# Patient Record
Sex: Male | Born: 1995 | Race: White | Hispanic: No | Marital: Single | Smoking: Never smoker
Health system: Southern US, Community
[De-identification: ages and names within clinical notes are randomized; demographics above are authoritative.]

## PROBLEM LIST (undated history)

## (undated) DIAGNOSIS — R55 Syncope and collapse: Secondary | ICD-10-CM

## (undated) HISTORY — DX: Syncope and collapse: R55

---
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
## 2021-11-30 ENCOUNTER — Emergency Department (HOSPITAL_BASED_OUTPATIENT_CLINIC_OR_DEPARTMENT_OTHER)
Admission: EM | Admit: 2021-11-30 | Discharge: 2021-11-30 | Disposition: A | Discharge status: Home / Self Care | Payer: BC Managed Care – PPO | Attending: Emergency Medicine | Admitting: Emergency Medicine

## 2021-11-30 ENCOUNTER — Encounter (HOSPITAL_BASED_OUTPATIENT_CLINIC_OR_DEPARTMENT_OTHER): Payer: Self-pay | Admitting: Emergency Medicine

## 2021-11-30 ENCOUNTER — Other Ambulatory Visit: Payer: Self-pay

## 2021-11-30 ENCOUNTER — Emergency Department (HOSPITAL_BASED_OUTPATIENT_CLINIC_OR_DEPARTMENT_OTHER): Payer: BC Managed Care – PPO

## 2021-11-30 DIAGNOSIS — R55 Syncope and collapse: Secondary | ICD-10-CM | POA: Diagnosis not present

## 2021-11-30 LAB — URINALYSIS, MICROSCOPIC (REFLEX)

## 2021-11-30 LAB — URINALYSIS, ROUTINE W REFLEX MICROSCOPIC
Bilirubin Urine: NEGATIVE
Glucose, UA: NEGATIVE mg/dL
Hgb urine dipstick: NEGATIVE
Ketones, ur: NEGATIVE mg/dL
Leukocytes,Ua: NEGATIVE
Nitrite: NEGATIVE
Protein, ur: 30 mg/dL — AB
Specific Gravity, Urine: 1.025 (ref 1.005–1.030)
pH: 6 (ref 5.0–8.0)

## 2021-11-30 LAB — COMPREHENSIVE METABOLIC PANEL
ALT: 17 U/L (ref 0–44)
AST: 21 U/L (ref 15–41)
Albumin: 4.7 g/dL (ref 3.5–5.0)
Alkaline Phosphatase: 59 U/L (ref 38–126)
Anion gap: 7 (ref 5–15)
BUN: 13 mg/dL (ref 6–20)
CO2: 26 mmol/L (ref 22–32)
Calcium: 9.8 mg/dL (ref 8.9–10.3)
Chloride: 106 mmol/L (ref 98–111)
Creatinine, Ser: 0.96 mg/dL (ref 0.61–1.24)
GFR, Estimated: >60 mL/min (ref >60)
Glucose, Bld: 106 mg/dL — ABNORMAL HIGH (ref 70–99)
Potassium: 4.1 mmol/L (ref 3.5–5.1)
Sodium: 139 mmol/L (ref 135–145)
Total Bilirubin: 0.6 mg/dL (ref 0.3–1.2)
Total Protein: 7.5 g/dL (ref 6.5–8.1)

## 2021-11-30 LAB — CBC WITH DIFFERENTIAL/PLATELET
Abs Immature Granulocytes: 0.03 10*3/uL (ref 0.00–0.07)
Basophils Absolute: 0 10*3/uL (ref 0.0–0.1)
Basophils Relative: 1 %
Eosinophils Absolute: 0 10*3/uL (ref 0.0–0.5)
Eosinophils Relative: 0 %
HCT: 44.8 % (ref 39.0–52.0)
Hemoglobin: 15.7 g/dL (ref 13.0–17.0)
Immature Granulocytes: 0 %
Lymphocytes Relative: 12 %
Lymphs Abs: 0.9 10*3/uL (ref 0.7–4.0)
MCH: 31 pg (ref 26.0–34.0)
MCHC: 35 g/dL (ref 30.0–36.0)
MCV: 88.4 fL (ref 80.0–100.0)
Monocytes Absolute: 0.5 10*3/uL (ref 0.1–1.0)
Monocytes Relative: 7 %
Neutro Abs: 6.2 10*3/uL (ref 1.7–7.7)
Neutrophils Relative %: 80 %
Platelets: 197 10*3/uL (ref 150–400)
RBC: 5.07 MIL/uL (ref 4.22–5.81)
RDW: 12 % (ref 11.5–15.5)
WBC: 7.7 10*3/uL (ref 4.0–10.5)
nRBC: 0 % (ref 0.0–0.2)

## 2021-11-30 LAB — RAPID URINE DRUG SCREEN, HOSP PERFORMED
Amphetamines: NOT DETECTED
Barbiturates: NOT DETECTED
Benzodiazepines: NOT DETECTED
Cocaine: NOT DETECTED
Opiates: NOT DETECTED
Tetrahydrocannabinol: NOT DETECTED

## 2021-11-30 MED ORDER — SODIUM CHLORIDE 0.9 % IV BOLUS
1000.0000 mL | Freq: Once | INTRAVENOUS | Status: AC
  Administered 2021-11-30: 1000 mL via INTRAVENOUS

## 2021-11-30 NOTE — Discharge Instructions (Addendum)
Follow-up with neurology for further seizure work-up.  Do not do any dangerous activities including driving as discussed.  Follow-up with cardiology as well to discuss wearing a cardiac monitor/cardiac ultrasound.

## 2021-11-30 NOTE — ED Notes (Signed)
Patient transported to CT and xray 
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
## 2021-12-03 ENCOUNTER — Ambulatory Visit: Payer: BC Managed Care – PPO | Admitting: Psychiatry

## 2021-12-03 ENCOUNTER — Other Ambulatory Visit: Payer: Self-pay

## 2021-12-03 ENCOUNTER — Encounter: Payer: Self-pay | Admitting: Psychiatry

## 2021-12-03 VITALS — BP 118/76 | HR 82 | Ht 73.0 in | Wt 144.0 lb

## 2021-12-03 DIAGNOSIS — R55 Syncope and collapse: Secondary | ICD-10-CM
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
## 2021-12-05 ENCOUNTER — Other Ambulatory Visit: Payer: Self-pay

## 2021-12-05 ENCOUNTER — Encounter: Payer: Self-pay | Admitting: Cardiology

## 2021-12-05 ENCOUNTER — Ambulatory Visit (INDEPENDENT_AMBULATORY_CARE_PROVIDER_SITE_OTHER): Payer: BC Managed Care – PPO

## 2021-12-05 ENCOUNTER — Ambulatory Visit: Payer: BC Managed Care – PPO | Admitting: Cardiology

## 2021-12-05 VITALS — BP 114/62 | HR 66 | Ht 73.0 in | Wt 143.0 lb

## 2021-12-05 DIAGNOSIS — R55 Syncope and collapse: Secondary | ICD-10-CM
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
## 2021-12-05 NOTE — Progress Notes (Unsigned)
Enrolled for [employer] to mail a ZIO XT long term holter monitor to the patients address on file.  

## 2021-12-05 NOTE — Patient Instructions (Signed)
Medication Instructions:  °Your physician recommends that you continue on your current medications as directed. Please refer to the Current Medication list given to you today.  °*If you need a refill on your cardiac medications before your next appointment, please call your pharmacy* ° ° °Lab Work: °None °If you have labs (blood work) drawn today and your tests are completely normal, you will receive your results only by: °MyChart Message (if you have MyChart) OR °A paper copy in the mail °If you have any lab test that is abnormal or we need to change your treatment, we will call you to review the results. ° ° °Testing/Procedures: °Your physician has requested that you have an echocardiogram. Echocardiography is a painless test that uses sound waves to create images of your heart. It provides your doctor with information about the size and shape of your heart and how well your heart’s chambers and valves are working. This procedure takes approximately one hour. There are no restrictions for this procedure. ° °ZIO XT- Long Term Monitor Instructions ° °Your physician has requested you wear a ZIO patch monitor for 14 days.  °This is a single patch monitor. [employer] supplies one patch monitor per enrollment. Additional °stickers are not available. Please do not apply patch if you will be having a Nuclear Stress Test,  °Echocardiogram, Cardiac CT, MRI, or Chest Xray during the period you would be wearing the  °monitor. The patch cannot be worn during these tests. You cannot remove and re-apply the  °ZIO XT patch monitor.  °Your ZIO patch monitor will be mailed 3 day USPS to your address on file. It may take 3-5 days  °to receive your monitor after you have been enrolled.  °Once you have received your monitor, please review the enclosed instructions. Your monitor  °has already been registered assigning a specific monitor serial # to you. ° °Billing and Patient Assistance Program Information ° °We have supplied [employer] with  any of your insurance information on file for billing purposes. °[employer] offers a sliding scale Patient Assistance Program for patients that do not have  °insurance, or whose insurance does not completely cover the cost of the ZIO monitor.  °You must apply for the Patient Assistance Program to qualify for this discounted rate.  °To apply, please call [employer] at [phone], select option 4, select option 2, ask to apply for  °Patient Assistance Program. [employer] will ask your household income, and how many people  °are in your household. They will quote your out-of-pocket cost based on that information.  °[employer] will also be able to set up a 12-month, interest-free payment plan if needed. ° °Applying the monitor °  °Shave hair from upper left chest.  °Hold abrader disc by orange tab. Rub abrader in 40 strokes over the upper left chest as  °indicated in your monitor instructions.  °Clean area with 4 enclosed alcohol pads. Let dry.  °Apply patch as indicated in monitor instructions. Patch will be placed under collarbone on left  °side of chest with arrow pointing upward.  °Rub patch adhesive wings for 2 minutes. Remove white label marked "1". Remove the white  °label marked "2". Rub patch adhesive wings for 2 additional minutes.  °While looking in a mirror, press and release button in center of patch. A small green light will  °flash 3-4 times. This will be your only indicator that the monitor has been turned on.  °Do not shower for the first 24 hours. You may shower after the first   24 hours.  °Press the button if you feel a symptom. You will hear a small click. Record Date, Time and  °Symptom in the Patient Logbook.  °When you are ready to remove the patch, follow instructions on the last 2 pages of Patient  °Logbook. Stick patch monitor onto the last page of Patient Logbook.  °Place Patient Logbook in the blue and white box. Use locking tab on box and tape box closed  °securely. The blue and white box has prepaid  postage on it. Please place it in the mailbox as  °soon as possible. Your physician should have your test results approximately 7 days after the  °monitor has been mailed back to [employer].  °Call [employer] Customer Care at [phone] if you have questions regarding  °your ZIO XT patch monitor. Call them immediately if you see an orange light blinking on your  °monitor.  °If your monitor falls off in less than 4 days, contact our Monitor department at [phone].  °If your monitor becomes loose or falls off after 4 days call [employer] at [phone] for  °suggestions on securing your monitor ° ° ° °Follow-Up: °At [facility], you and your health needs are our priority.  As part of our continuing mission to provide you with exceptional heart care, we have created designated Provider Care Teams.  These Care Teams include your primary Cardiologist (physician) and Advanced Practice Providers (APPs -  Physician Assistants and Nurse Practitioners) who all work together to provide you with the care you need, when you need it. ° °We recommend signing up for the patient portal called "MyChart".  Sign up information is provided on this After Visit Summary.  MyChart is used to connect with patients for Virtual Visits (Telemedicine).  Patients are able to view lab/test results, encounter notes, upcoming appointments, etc.  Non-urgent messages can be sent to your provider as well.   °To learn more about what you can do with MyChart, go to [email or web address].   ° °Your next appointment:   °6 month(s) ° °The format for your next appointment:   °In Person ° °Provider:   °Christopher L Schumann, MD   ° ° °Other Instructions °  °

## 2021-12-20 ENCOUNTER — Ambulatory Visit (HOSPITAL_COMMUNITY): Payer: BC Managed Care – PPO | Attending: Cardiovascular Disease

## 2021-12-20 ENCOUNTER — Other Ambulatory Visit: Payer: Self-pay

## 2021-12-20 DIAGNOSIS — R55 Syncope and collapse: Secondary | ICD-10-CM

## 2021-12-20 LAB — ECHOCARDIOGRAM COMPLETE
Area-P 1/2: 4.49 cm2
S' Lateral: 3.3 cm

## 2021-12-21 DIAGNOSIS — R55 Syncope and collapse: Secondary | ICD-10-CM | POA: Diagnosis not present
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
